# Patient Record
Sex: Male | Born: 1990 | Race: White | Hispanic: No | Marital: Married | Smoking: Current every day smoker
Health system: Southern US, Community
[De-identification: ages and names within clinical notes are randomized; demographics above are authoritative.]

## PROBLEM LIST (undated history)

## (undated) DIAGNOSIS — M1990 Unspecified osteoarthritis, unspecified site: Secondary | ICD-10-CM

## (undated) HISTORY — PX: CLAVICLE SURGERY: SHX598

---

## 2015-02-12 ENCOUNTER — Encounter (HOSPITAL_COMMUNITY): Payer: Self-pay | Admitting: *Deleted

## 2015-02-12 ENCOUNTER — Emergency Department (HOSPITAL_COMMUNITY)
Admission: EM | Admit: 2015-02-12 | Discharge: 2015-02-12 | Disposition: A | Discharge status: Home / Self Care | Payer: Self-pay | Attending: Physician Assistant | Admitting: Physician Assistant

## 2015-02-12 ENCOUNTER — Emergency Department (HOSPITAL_COMMUNITY): Payer: Self-pay

## 2015-02-12 DIAGNOSIS — S299XXA Unspecified injury of thorax, initial encounter: Secondary | ICD-10-CM | POA: Insufficient documentation

## 2015-02-12 DIAGNOSIS — Y9371 Activity, boxing: Secondary | ICD-10-CM | POA: Insufficient documentation

## 2015-02-12 DIAGNOSIS — Z720 Tobacco use: Secondary | ICD-10-CM | POA: Insufficient documentation

## 2015-02-12 DIAGNOSIS — Y998 Other external cause status: Secondary | ICD-10-CM | POA: Insufficient documentation

## 2015-02-12 DIAGNOSIS — Y9289 Other specified places as the place of occurrence of the external cause: Secondary | ICD-10-CM | POA: Insufficient documentation

## 2015-02-12 DIAGNOSIS — R0781 Pleurodynia: Secondary | ICD-10-CM

## 2015-02-12 DIAGNOSIS — W500XXA Accidental hit or strike by another person, initial encounter: Secondary | ICD-10-CM | POA: Insufficient documentation

## 2015-02-12 MED ORDER — HYDROCODONE-ACETAMINOPHEN 5-325 MG PO TABS
1.0000 | ORAL_TABLET | Freq: Once | ORAL | Status: AC
  Administered 2015-02-12: 1 via ORAL
  Filled 2015-02-12: qty 1

## 2015-02-12 NOTE — Discharge Instructions (Signed)
Rib Contusion °A rib contusion (bruise) can occur by a blow to the chest or by a fall against a hard object. Usually these will be much better in a couple weeks. If X-rays were taken today and there are no broken bones (fractures), the diagnosis of bruising is made. However, broken ribs may not show up for several days, or may be discovered later on a routine X-ray when signs of healing show up. If this happens to you, it does not mean that something was missed on the X-ray, but simply that it did not show up on the first X-rays. Earlier diagnosis will not usually change the treatment. °HOME CARE INSTRUCTIONS  °· Avoid strenuous activity. Be careful during activities and avoid bumping the injured ribs. Activities that pull on the injured ribs and cause pain should be avoided, if possible. °· For the first day or two, an ice pack used every 20 minutes while awake may be helpful. Put ice in a plastic bag and put a towel between the bag and the skin. °· Eat a normal, well-balanced diet. Drink plenty of fluids to avoid constipation. °· Take deep breaths several times a day to keep lungs free of infection. Try to cough several times a day. Splint the injured area with a pillow while coughing to ease pain. Coughing can help prevent pneumonia. °· Wear a rib belt or binder only if told to do so by your caregiver. If you are wearing a rib belt or binder, you must do the breathing exercises as directed by your caregiver. If not used properly, rib belts or binders restrict breathing which can lead to pneumonia. °· Only take over-the-counter or prescription medicines for pain, discomfort, or fever as directed by your caregiver. °SEEK MEDICAL CARE IF:  °· You or your child has an oral temperature above 102° F (38.9° C). °· Your baby is older than 3 months with a rectal temperature of 100.5° F (38.1° C) or higher for more than 1 day. °· You develop a cough, with thick or bloody sputum. °SEEK IMMEDIATE MEDICAL CARE IF:  °· You  have difficulty breathing. °· You feel sick to your stomach (nausea), have vomiting or belly (abdominal) pain. °· You have worsening pain, not controlled with medications, or there is a change in the location of the pain. °· You develop sweating or radiation of the pain into the arms, jaw or shoulders, or become light headed or faint. °· You or your child has an oral temperature above 102° F (38.9° C), not controlled by medicine. °· Your or your baby is older than 3 months with a rectal temperature of 102° F (38.9° C) or higher. °· Your baby is 3 months old or younger with a rectal temperature of 100.4° F (38° C) or higher. °MAKE SURE YOU:  °· Understand these instructions. °· Will watch your condition. °· Will get help right away if you are not doing well or get worse. °Document Released: 01/25/2001 Document Revised: 08/27/2012 Document Reviewed: 12/19/2007 °ExitCare® Patient Information ©2015 Exit[employer], LLC. This information is not intended to replace advice given to you by your health care provider. Make sure you discuss any questions you have with your health care provider. ° °
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
## 2015-02-12 NOTE — ED Notes (Signed)
Patient transported to X-ray 
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
## 2015-02-12 NOTE — ED Notes (Signed)
NP at bedside.

## 2015-04-13 ENCOUNTER — Emergency Department (HOSPITAL_COMMUNITY): Payer: Self-pay

## 2015-04-13 ENCOUNTER — Emergency Department (HOSPITAL_COMMUNITY)
Admission: EM | Admit: 2015-04-13 | Discharge: 2015-04-13 | Disposition: A | Discharge status: Home / Self Care | Payer: Self-pay | Attending: Emergency Medicine | Admitting: Emergency Medicine

## 2015-04-13 ENCOUNTER — Encounter (HOSPITAL_COMMUNITY): Payer: Self-pay | Admitting: Emergency Medicine

## 2015-04-13 DIAGNOSIS — F17200 Nicotine dependence, unspecified, uncomplicated: Secondary | ICD-10-CM | POA: Insufficient documentation

## 2015-04-13 DIAGNOSIS — W231XXA Caught, crushed, jammed, or pinched between stationary objects, initial encounter: Secondary | ICD-10-CM | POA: Insufficient documentation

## 2015-04-13 DIAGNOSIS — Z23 Encounter for immunization: Secondary | ICD-10-CM | POA: Insufficient documentation

## 2015-04-13 DIAGNOSIS — Y9289 Other specified places as the place of occurrence of the external cause: Secondary | ICD-10-CM | POA: Insufficient documentation

## 2015-04-13 DIAGNOSIS — Y998 Other external cause status: Secondary | ICD-10-CM | POA: Insufficient documentation

## 2015-04-13 DIAGNOSIS — Y9389 Activity, other specified: Secondary | ICD-10-CM | POA: Insufficient documentation

## 2015-04-13 DIAGNOSIS — S60221A Contusion of right hand, initial encounter: Secondary | ICD-10-CM | POA: Insufficient documentation

## 2015-04-13 DIAGNOSIS — S60511A Abrasion of right hand, initial encounter: Secondary | ICD-10-CM | POA: Insufficient documentation

## 2015-04-13 MED ORDER — TETANUS-DIPHTH-ACELL PERTUSSIS 5-2.5-18.5 LF-MCG/0.5 IM SUSP
0.5000 mL | Freq: Once | INTRAMUSCULAR | Status: AC
  Administered 2015-04-13: 0.5 mL via INTRAMUSCULAR
  Filled 2015-04-13: qty 0.5

## 2015-04-13 NOTE — ED Provider Notes (Signed)
CSN: [id number]     Arrival date & time 04/13/15  [id number] History   First MD Initiated Contact with Patient 04/13/15 [phone]     Chief Complaint  Patient presents with  . Hand Injury     (Consider location/radiation/quality/duration/timing/severity/associated sxs/prior Treatment) HPI Darren Dominguez is a 24 y.o. male with no medical history, presents to emergency department complaining of right hand injury. 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 He reports that it was stuck and he had to pull it out to free it. He reports he has abrasions and pain and swelling to the hand. Patient is able to move all fingers without difficulty. Denies numbness or weakness to the fingers. Patient states he just wants to make sure it is not broken. Patient states he applied bacitracin to the abrasions and iced his hand yesterday.  History reviewed. No pertinent past medical history. History reviewed. No pertinent past surgical history. No family history on file. Social History  Substance Use Topics  . Smoking status: Current Every Day Smoker  . Smokeless tobacco: None  . Alcohol Use: None    Review of Systems  Musculoskeletal: Positive for joint swelling and arthralgias.  Skin: Positive for wound.  Neurological: Negative for weakness and numbness.      Allergies  Review of patient's allergies indicates no known allergies.  Home Medications   Prior to Admission medications   Not on File   BP 92/77 mmHg  Pulse 87  Temp(Src) 97.6 F (36.4 C) (Oral)  Resp 17  SpO2 100% Physical Exam  Constitutional: He appears well-developed and well-nourished. No distress.  HENT:  Head: Normocephalic and atraumatic.  Eyes: Conjunctivae are normal.  Neck: Neck supple.  Cardiovascular: Normal rate, regular rhythm and normal heart sounds.   Pulmonary/Chest: Effort normal. No respiratory distress. He has no wheezes. He has no  rales.  Musculoskeletal:  Swelling noted to the right hand, over dorsal surface, over third, fourth, fifth MCP joints. Abrasions noted between third and fourth MCP joints. Full range of motion of all fingers. Cap refill less than 2 seconds distally. Sensation is intact over dorsal and palmar surfaces of the fingers. No tenderness to palpation distally. No swelling distal to the MCP joints   Nursing note and vitals reviewed.   ED Course  Procedures (including critical care time) Labs Review Labs Reviewed - No data to display  Imaging Review Dg Hand Complete Right  04/13/2015  CLINICAL DATA:  Injury.  Pain.  Initial evaluation. EXAM: RIGHT HAND - COMPLETE 3+ VIEW COMPARISON:  None. FINDINGS: Small amount of soft tissue air between the third and fourth metacarpals cannot be excluded. No radiopaque foreign body. No evidence of fracture dislocation. IMPRESSION: No evidence of fracture or dislocation. Small amount of soft tissue air may be present between the third and fourth metacarpals. No foreign body . Electronically Signed   By: Maisie Fushomas  Register   On: 04/13/2015 08:14   I have personally reviewed and evaluated these images and lab results as part of my medical decision-making.   EKG Interpretation None      MDM   Final diagnoses:  Hand contusion, right, initial encounter  Hand abrasion, right, initial encounter    Patient with a crush injury of the right hand while moving furniture yesterday. Patient has small abrasions between his knuckles of the hand. These are superficial. Advised to apply bacitracin twice a day. Tetanus updated.  Patient also has some swelling over the MCP joints of the hand. X-rays negative. Most likely deep contusion. Ace wrap provided for compression. Advised to ice, elevate, ibuprofen for pain. Follow up as needed.   Filed Vitals:   04/13/15 0738 04/13/15 0745 04/13/15 0815  BP: 140/78 127/82 92/77  Pulse: 90 93 87  Temp: 97.6 F (36.4 C)    TempSrc: Oral     Resp: 17    SpO2: 100% 98% 100%       Jaynie Crumble, PA-C 04/15/15 0248  Eber Hong, MD 04/15/15 1034

## 2015-04-13 NOTE — Discharge Instructions (Signed)
Keep your hand elevated at home. Ace wrap for compression. Tylenol and Motrin for pain. Bacitracin to the abrasions twice a day. Follow up as needed.  Crush Injury, Fingers or Toes A crush injury to the fingers or toes means the tissues have been damaged by being squeezed (compressed). There will be bleeding into the tissues and swelling. Often, blood will collect under the skin. When this happens, the skin on the finger often dies and may slough off (shed) 1 week to 10 days later. Usually, new skin is growing underneath. If the injury has been too severe and the tissue does not survive, the damaged tissue may begin to turn black over several days.  Wounds which occur because of the crushing may be stitched (sutured) shut. However, crush injuries are more likely to become infected than other injuries.These wounds may not be closed as tightly as other types of cuts to prevent infection. Nails involved are often lost. These usually grow back over several weeks.  DIAGNOSIS X-rays may be taken to see if there is any injury to the bones. TREATMENT Broken bones (fractures) may be treated with splinting, depending on the fracture. Often, no treatment is required for fractures of the last bone in the fingers or toes. HOME CARE INSTRUCTIONS   The crushed part should be raised (elevated) above the heart or center of the chest as much as possible for the first several days or as directed. This helps with pain and lessens swelling. Less swelling increases the chances that the crushed part will survive.  Put ice on the injured area.  Put ice in a plastic bag.  Place a towel between your skin and the bag.  Leave the ice on for 15-20 minutes, 03-04 times a day for the first 2 days.  Only take over-the-counter or prescription medicines for pain, discomfort, or fever as directed by your caregiver.  Use your injured part only as directed.  Change your bandages (dressings) as directed.  Keep all follow-up  appointments as directed by your caregiver. Not keeping your appointment could result in a chronic or permanent injury, pain, and disability. If there is any problem keeping the appointment, you must call to reschedule. SEEK IMMEDIATE MEDICAL CARE IF:   There is redness, swelling, or increasing pain in the wound area.  Pus is coming from the wound.  You have a fever.  You notice a bad smell coming from the wound or dressing.  The edges of the wound do not stay together after the sutures have been removed.  You are unable to move the injured finger or toe. MAKE SURE YOU:   Understand these instructions.  Will watch your condition.  Will get help right away if you are not doing well or get worse.   This information is not intended to replace advice given to you by your health care provider. Make sure you discuss any questions you have with your health care provider.   Document Released: 05/02/2005 Document Revised: 07/25/2011 Document Reviewed: 09/17/2010 Elsevier Interactive Patient Education [employer]2016 Elsevier Inc.

## 2015-04-13 NOTE — ED Notes (Signed)
Patient undressed, in gown, on continuous pulse oximetry and blood pressure cuff; Kaitlyn, EMT present in room

## 2015-04-13 NOTE — ED Notes (Signed)
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 Laceration noted to 3-5 digits. Patient able to move all digits. Denise any change in sensation.

## 2015-08-19 DIAGNOSIS — F1110 Opioid abuse, uncomplicated: Secondary | ICD-10-CM | POA: Insufficient documentation

## 2015-08-19 DIAGNOSIS — M791 Myalgia: Secondary | ICD-10-CM | POA: Insufficient documentation

## 2015-08-19 DIAGNOSIS — R110 Nausea: Secondary | ICD-10-CM | POA: Insufficient documentation

## 2015-08-19 DIAGNOSIS — F17200 Nicotine dependence, unspecified, uncomplicated: Secondary | ICD-10-CM | POA: Insufficient documentation

## 2015-08-19 DIAGNOSIS — F919 Conduct disorder, unspecified: Secondary | ICD-10-CM | POA: Insufficient documentation

## 2015-08-20 ENCOUNTER — Encounter (HOSPITAL_COMMUNITY): Payer: Self-pay | Admitting: Emergency Medicine

## 2015-08-20 ENCOUNTER — Emergency Department (HOSPITAL_COMMUNITY)
Admission: EM | Admit: 2015-08-20 | Discharge: 2015-08-20 | Disposition: A | Discharge status: Home / Self Care | Payer: Self-pay | Attending: Emergency Medicine | Admitting: Emergency Medicine

## 2015-08-20 DIAGNOSIS — F1110 Opioid abuse, uncomplicated: Secondary | ICD-10-CM

## 2015-08-20 MED ORDER — IBUPROFEN 800 MG PO TABS
800.0000 mg | ORAL_TABLET | Freq: Once | ORAL | Status: AC
  Administered 2015-08-20: 800 mg via ORAL
  Filled 2015-08-20: qty 1

## 2015-08-20 MED ORDER — CLONIDINE HCL 0.1 MG PO TABS
0.1000 mg | ORAL_TABLET | Freq: Once | ORAL | Status: AC
  Administered 2015-08-20: 0.1 mg via ORAL
  Filled 2015-08-20: qty 1

## 2015-08-20 MED ORDER — ONDANSETRON 4 MG PO TBDP
4.0000 mg | ORAL_TABLET | Freq: Once | ORAL | Status: AC
  Administered 2015-08-20: 4 mg via ORAL
  Filled 2015-08-20: qty 1

## 2015-08-20 MED ORDER — PROMETHAZINE HCL 25 MG PO TABS: 25.0000 mg | ORAL_TABLET | Freq: Four times a day (QID) | ORAL | Status: AC | PRN

## 2015-08-20 MED ORDER — CLONIDINE HCL 0.1 MG PO TABS: 0.1000 mg | ORAL_TABLET | Freq: Two times a day (BID) | ORAL | Status: AC | PRN

## 2015-08-20 MED ORDER — IBUPROFEN 400 MG PO TABS: 400.0000 mg | ORAL_TABLET | Freq: Four times a day (QID) | ORAL | Status: DC | PRN

## 2015-08-20 NOTE — Discharge Instructions (Signed)
Opioid Withdrawal Opioids are a group of narcotic drugs. They include the street drug heroin. They also include pain medicines, such as morphine, hydrocodone, oxycodone, and fentanyl. Opioid withdrawal is a group of characteristic physical and mental signs and symptoms. It typically occurs if you have been using opioids daily for several weeks or longer and stop using or rapidly decrease use. Opioid withdrawal can also occur if you have used opioids daily for a long time and are given a medicine to block the effect.  SIGNS AND SYMPTOMS Opioid withdrawal includes three or more of the following symptoms:   Depressed, anxious, or irritable mood.  Nausea or vomiting.  Muscle aches or spasms.   Watery eyes.   Runny nose.  Dilated pupils, sweating, or hairs standing on end.  Diarrhea or intestinal cramping.  Yawning.   Fever.  Increased blood pressure.  Fast pulse.  Restlessness or trouble sleeping. These signs and symptoms occur within several hours of stopping or reducing short-acting opioids, such as heroin. They can occur within 3 days of stopping or reducing long-acting opioids, such as methadone. Withdrawal begins within minutes of receiving a drug that blocks the effects of opioids, such as naltrexone or naloxone. DIAGNOSIS  Opioid use disorder is diagnosed by your health care provider. You will be asked about your symptoms, drug and alcohol use, medical history, and use of medicines. A physical exam may be done. Lab tests may be ordered. Your health care provider may have you see a mental health professional.  TREATMENT  The treatment for opioid withdrawal is usually provided by medical doctors with special training in substance use disorders (addiction specialists). The following medicines may be included in treatment:  Opioids given in place of the abused opioid. They turn on opioid receptors in the brain and lessen or prevent withdrawal symptoms. They are gradually  decreased (opioid substitution and taper).  Non-opioids that can lessen certain opioid withdrawal symptoms. They may be used alone or with opioid substitution and taper. Successful long-term recovery usually requires medicine, counseling, and group support. HOME CARE INSTRUCTIONS   Take medicines only as directed by your health care provider.  Check with your health care provider before starting new medicines.  Keep all follow-up visits as directed by your health care provider. SEEK MEDICAL CARE IF:  You are not able to take your medicines as directed.  Your symptoms get worse.  You relapse. SEEK IMMEDIATE MEDICAL CARE IF:  You have serious thoughts about hurting yourself or others.  You have a seizure.  You lose consciousness.   This information is not intended to replace advice given to you by your health care provider. Make sure you discuss any questions you have with your health care provider.   Document Released: 05/05/2003 Document Revised: 05/23/2014 Document Reviewed: 05/15/2013 Elsevier Interactive Patient Education 2016 [employer].  [employer] Guide Inpatient Behavioral Health/Residential  Substance Abuse Treatment Adults The United Ways 211 is a great source of information about community services available.  Access by dialing 2-1-1 from anywhere in [place], or by website -  PooledIncome.pl.   (Updated 05/2015)  Crisis Assistance 24 hours a day   Services Offered    [facility] [employer]  24-hour crisis assistance: [phone] [place], [place]   [facility]  24-hour crisis assistance:[phone] [place], [place]  [facility]   24-hour crisis assistance: [phone] [place], [place]   [facility] Access to Care Line  24-hour crisis assistance; [phone] All   [facility]  24-hour crisis response line: [phone] All   Other Local Resources (Updated  05/2015)  Inpatient Behavioral Health/Residential Substance Abuse Treatment Programs   Services      Address and Phone Number  [facility])   14-day residential rehabilitation  [phone] 100 [address] [place], [place]  [facility] ([facility])    Detox - private pay only  14-day residential rehabilitation -  Medicaid, insurance, private pay only [phone], or [phone] [address], [place], [place] [address]   [facility]  Private Insurance only  Multiple facilities [phone] admissions   [facility] ([facility])   90-day program  Must be homeless to participate  [phone], or [phone] [place], [facility]  [facility]     Private Insurance only [phone], or  [phone] [address] [place], [place] [address]  [facility]     Must make an appointment  Transportation is offered from [employer] on [address]  Accepts private pay, Sheryn BisonMedicare, [place] [facility] [place] Medicaid [phone]  [address] [place] [facility] [place], [place] [address]   [facility]  Females only  Associated with [facility] [phone]) [address] [place], [place] [address]  [facility]   Private insurance only [phone], or [phone] [address] [place], [place]  [facility]    Detox  Residential rehabilitation  Private insurance only  Multiple locations [phone] admissions  [facility]    Private pay  Private insurance [phone] [address] [place], [place] [address]  [facility]    Males only  Fee required at time of admission [phone] [address] [place], [place] [address]  [facility]    Private pay only  [phone] [place], [place]  [facility] ([facility])    Detox - private pay, Medicaid  Residential  rehabilitation for males  - Medicare, Medicaid, insurance, private pay [phone] [address] [place], [place]   [facility]    Walk-in interviews Monday - Saturday from 8 am - 4 pm  Individuals with legal charges are not eligible [phone] [address] [place], [place] [address]  [facility]   Must be willing to work  Must attend Alcoholics Anonymous meetings [phone] [address] [place], [place]   [facility]    Faith-based program  Private pay only [phone] [address] [place], [place]

## 2015-08-20 NOTE — ED Notes (Signed)
Pt requesting detox from opiates. Pt states he has been buying and injecting Roxicodone x 1 month and feels he has reached his bottom.

## 2015-08-20 NOTE — ED Provider Notes (Signed)
CSN: [id number]     Arrival date & time 08/19/15  2241 History   First MD Initiated Contact with Patient 08/20/15 0138     Chief Complaint  Patient presents with  . Detox      (Consider location/radiation/quality/duration/timing/severity/associated sxs/prior Treatment) HPI Comments: 25 year old male with no significant past medical history presents to the emergency department for evaluation for detox from opiates. Patient states that he has been using IV heroin and Roxicodone 1 month. He states he has a history of IV drug abuse. He was sober for 3 years before he started using again. His last use of IV heroin was yesterday morning. He reports feeling mildly achy and tremulous with some nausea. He last [place] detox and [place]. He denies any suicidal or homicidal thoughts. Patient further denies alcohol use.  The history is provided by the patient. No language interpreter was used.    History reviewed. No pertinent past medical history. Past Surgical History  Procedure Laterality Date  . Clavicle surgery     No family history on file. Social History  Substance Use Topics  . Smoking status: Current Every Day Smoker  . Smokeless tobacco: None  . Alcohol Use: No    Review of Systems  Gastrointestinal: Positive for nausea.  Musculoskeletal: Positive for myalgias.  Psychiatric/Behavioral: Positive for behavioral problems. Negative for suicidal ideas.  All other systems reviewed and are negative.   Allergies  Review of patient's allergies indicates no known allergies.  Home Medications   Prior to Admission medications   Medication Sig Start Date End Date Taking? Authorizing Provider  cloNIDine (CATAPRES) 0.1 MG tablet Take 1 tablet (0.1 mg total) by mouth 2 (two) times daily as needed (for agitation). 08/20/15   Antony MaduraKelly Xayla Puzio, PA-C  ibuprofen (ADVIL,MOTRIN) 400 MG tablet Take 1 tablet (400 mg total) by mouth every 6 (six) hours as needed. 08/20/15    Antony MaduraKelly Edrian Melucci, PA-C  promethazine (PHENERGAN) 25 MG tablet Take 1 tablet (25 mg total) by mouth every 6 (six) hours as needed for nausea or vomiting. 08/20/15   Antony MaduraKelly Marthann Abshier, PA-C   BP 129/75 mmHg  Pulse 72  Temp(Src) 97.5 F (36.4 C) (Oral)  Resp 16  Ht 5\' 10"  (1.778 m)  Wt 63.504 kg  BMI 20.09 kg/m2  SpO2 98%   Physical Exam  Constitutional: He is oriented to person, place, and time. He appears well-developed and well-nourished. No distress.  Patient calm and cooperative. He is in no acute distress.  HENT:  Head: Normocephalic and atraumatic.  Eyes: Conjunctivae and EOM are normal. No scleral icterus.  Neck: Normal range of motion.  Pulmonary/Chest: Effort normal. No respiratory distress.  Musculoskeletal: Normal range of motion.  Neurological: He is alert and oriented to person, place, and time. He exhibits normal muscle tone. Coordination normal.  Skin: Skin is warm and dry. No rash noted. He is not diaphoretic. No erythema. No pallor.  Psychiatric: He has a normal mood and affect. His behavior is normal. He expresses no homicidal and no suicidal ideation.  Nursing note and vitals reviewed.   ED Course  Procedures (including critical care time) Labs Review Labs Reviewed - No data to display  Imaging Review No results found. I have personally reviewed and evaluated these images and lab results as part of my medical decision-making.   EKG Interpretation None      MDM   Final diagnoses:  Opiate abuse, continuous    25 year old male presents requesting detox from opiates. He  denies suicidality as well as any homicidal ideations. Patient is calm and cooperative. He has stable vital signs. Patient has been informed that inpatient detox is no longer offered at our facility. He will be discharged with medications for symptomatic management. Patient given resource guide for outpatient detox and rehabilitation. Return precautions given at discharge as well. Patient discharged in  satisfactory condition with no unaddressed concerns.   Filed Vitals:   08/20/15 0040  BP: 129/75  Pulse: 72  Temp: 97.5 F (36.4 C)  TempSrc: Oral  Resp: 16  Height:  (1.778 m)  Weight: 63.504 kg  SpO2: 98%      Antony Madura, PA-C 08/20/15 0241  Gilda Crease, MD 08/20/15 [phone]

## 2016-01-11 ENCOUNTER — Emergency Department (HOSPITAL_COMMUNITY)
Admission: EM | Admit: 2016-01-11 | Discharge: 2016-01-11 | Disposition: A | Discharge status: Home / Self Care | Payer: Self-pay | Attending: Emergency Medicine | Admitting: Emergency Medicine

## 2016-01-11 ENCOUNTER — Encounter (HOSPITAL_COMMUNITY): Payer: Self-pay | Admitting: *Deleted

## 2016-01-11 DIAGNOSIS — W268XXA Contact with other sharp object(s), not elsewhere classified, initial encounter: Secondary | ICD-10-CM | POA: Insufficient documentation

## 2016-01-11 DIAGNOSIS — F17200 Nicotine dependence, unspecified, uncomplicated: Secondary | ICD-10-CM | POA: Insufficient documentation

## 2016-01-11 DIAGNOSIS — S61219A Laceration without foreign body of unspecified finger without damage to nail, initial encounter: Secondary | ICD-10-CM

## 2016-01-11 DIAGNOSIS — S61012A Laceration without foreign body of left thumb without damage to nail, initial encounter: Secondary | ICD-10-CM | POA: Insufficient documentation

## 2016-01-11 DIAGNOSIS — Y929 Unspecified place or not applicable: Secondary | ICD-10-CM | POA: Insufficient documentation

## 2016-01-11 DIAGNOSIS — Y999 Unspecified external cause status: Secondary | ICD-10-CM | POA: Insufficient documentation

## 2016-01-11 DIAGNOSIS — Y939 Activity, unspecified: Secondary | ICD-10-CM | POA: Insufficient documentation

## 2016-01-11 MED ORDER — LIDOCAINE HCL (PF) 1 % IJ SOLN
10.0000 mL | Freq: Once | INTRAMUSCULAR | Status: AC
  Administered 2016-01-11: 10 mL via INTRADERMAL
  Filled 2016-01-11: qty 10

## 2016-01-11 NOTE — ED Triage Notes (Signed)
Pt states he cut his finger with a drill bit two hours ago. Bleeding controlled. Last tetanus 2 years ago.

## 2016-01-11 NOTE — Progress Notes (Signed)
Orthopedic Tech Progress Note Patient Details:  Darren Dominguez 14-Jan-1991 [id number]  Ortho Devices Type of Ortho Device: Finger splint Ortho Device/Splint Location: LUE thumb Ortho Device/Splint Interventions: Ordered, Application   Jennye MoccasinHughes, [name] 01/11/2016, 10:28 PM

## 2016-01-11 NOTE — ED Provider Notes (Signed)
[facility]-EMERGENCY DEPT Provider Note   CSN: [id number] Arrival date & time: 01/11/16  [id number]   By signing my name below, I, Darren Dominguez, attest that this documentation has been prepared under the direction and in the presence of non-physician practitioner, Arthor CaptainAbigail Keeghan Bialy, PA-C. Electronically Signed: Nelwyn SalisburyJoshua Dominguez, Scribe. 01/11/2016. 9:15 PM.   History   Chief Complaint Chief Complaint  Patient presents with  . Laceration   The history is provided by the patient. No language interpreter was used.     HPI Comments:  Darren Dominguez is a 25 y.o. male who presents to the Emergency Department complaining of sudden-onset unchanged left thumb laceration occurring about  4 hours ago. He endorses associated pain to the area, worsened by palpation. No alleviating factors indicated. Pt reports that a drill bit went into his finger. Pt denies any fever or chills. He reports that his tetanus status is up to date.   History reviewed. No pertinent past medical history.  There are no active problems to display for this patient.   Past Surgical History:  Procedure Laterality Date  . CLAVICLE SURGERY       Home Medications    Prior to Admission medications   Medication Sig Start Date End Date Taking? Authorizing Provider  cloNIDine (CATAPRES) 0.1 MG tablet Take 1 tablet (0.1 mg total) by mouth 2 (two) times daily as needed (for agitation). 08/20/15   Antony MaduraKelly Humes, PA-C  ibuprofen (ADVIL,MOTRIN) 400 MG tablet Take 1 tablet (400 mg total) by mouth every 6 (six) hours as needed. 08/20/15   Antony MaduraKelly Humes, PA-C  promethazine (PHENERGAN) 25 MG tablet Take 1 tablet (25 mg total) by mouth every 6 (six) hours as needed for nausea or vomiting. 08/20/15   Antony MaduraKelly Humes, PA-C    Family History History reviewed. No pertinent family history.  Social History Social History  Substance Use Topics  . Smoking status: Current Every Day Smoker  . Smokeless tobacco: Never Used  . Alcohol use No     Allergies     Review of patient's allergies indicates no known allergies.   Review of Systems Review of Systems  Constitutional: Negative for chills and fever.  Skin: Positive for wound.     Physical Exam Updated Vital Signs BP 130/66   Pulse 73   Temp 98.3 F (36.8 C) (Oral)   Resp 16   Ht 5\' 10"  (1.778 m)   Wt 155 lb 7 oz (70.5 kg)   SpO2 99%   BMI 22.30 kg/m   Physical Exam  Constitutional: He is oriented to person, place, and time. He appears well-developed and well-nourished.  HENT:  Head: Normocephalic and atraumatic.  Eyes: EOM are normal.  Neck: Normal range of motion.  Cardiovascular: Normal rate, regular rhythm, normal heart sounds and intact distal pulses.   Pulmonary/Chest: Effort normal and breath sounds normal. No respiratory distress.  Abdominal: Soft. He exhibits no distension. There is no tenderness.  Musculoskeletal: Normal range of motion.  Neurological: He is alert and oriented to person, place, and time.  Skin: Skin is warm and dry.  Laceration involving the left thumb about 1cm elliptical. Involves nail cuticle. Doesn't appear to have any nail bed injury.   Psychiatric: He has a normal mood and affect. Judgment normal.  Nursing note and vitals reviewed.    ED Treatments / Results  DIAGNOSTIC STUDIES:  Oxygen Saturation is 99% on RA, normal by my interpretation.    COORDINATION OF CARE:  9:33 PM Discussed treatment plan with pt at  bedside which included painkillers and sutures and pt agreed to plan.  Labs (all labs ordered are listed, but only abnormal results are displayed) Labs Reviewed - No data to display  EKG  EKG Interpretation None       Radiology No results found.  Procedures .Marland KitchenLaceration Repair Date/Time: 01/11/2016 9:40 PM Performed by: Arthor Captain Authorized by: Arthor Captain   Consent:    Consent obtained:  Verbal   Consent given by:  Patient Anesthesia (see MAR for exact dosages):    Anesthesia method:  Local  infiltration   Local anesthetic:  Lidocaine 1% w/o epi Laceration details:    Location:  Finger   Finger location:  L thumb   Length (cm):  1 Repair type:    Repair type:  Simple Treatment:    Area cleansed with:  Saline and Betadine   Amount of cleaning:  Standard   Irrigation solution:  Sterile saline   Visualized foreign bodies/material removed: no   Skin repair:    Repair method:  Sutures   Suture size:  5-0   Wound skin closure material used: FivoVicryl.   Suture technique:  Simple interrupted   Number of sutures:  4 Post-procedure details:    Patient tolerance of procedure:  Tolerated well, no immediate complications    (including critical care time)  Medications Ordered in ED Medications  lidocaine (PF) (XYLOCAINE) 1 % injection 10 mL (not administered)   Tetanus UTD. Laceration occurred < 12 hours prior to repair. Discussed laceration care with pt and answered questions. Pt to f-u for suture removal in  days and wound check sooner should there be signs of dehiscence or infection. Pt is hemodynamically stable with no complaints prior to dc.     Initial Impression / Assessment and Plan / ED Course  I have reviewed the triage vital signs and the nursing notes.  Pertinent labs & imaging results that were available during my care of the patient were reviewed by me and considered in my medical decision making (see chart for details).  Clinical Course       Final Clinical Impressions(s) / ED Diagnoses   Final diagnoses:  Finger laceration, initial encounter    New Prescriptions New Prescriptions   No medications on file  I personally performed the services described in this documentation, which was scribed in my presence. The recorded information has been reviewed and is accurate.        Arthor Captain, PA-C 01/11/16 2230    Marily Memos, MD 01/14/16 [phone]

## 2016-01-11 NOTE — Discharge Instructions (Signed)
WOUND CARE Please return if you have concerns about your wound.  Keep area clean and dry for 24 hours. Do not remove bandage, if applied.  After 24 hours, remove bandage and wash wound gently with mild soap and warm water. Reapply a new bandage after cleaning wound, if directed.  Continue daily cleansing with soap and water until stitches/staples are removed.  Do not apply any ointments or creams to the wound while stitches/staples are in place, as this may cause delayed healing.  Notify the office if you experience any of the following signs of infection: Swelling, redness, pus drainage, streaking, fever >101.0 F  Notify the office if you experience excessive bleeding that does not stop after 15-20 minutes of constant, firm pressure.

## 2017-03-15 ENCOUNTER — Encounter (HOSPITAL_COMMUNITY): Payer: Self-pay | Admitting: Emergency Medicine

## 2017-03-15 ENCOUNTER — Emergency Department (HOSPITAL_COMMUNITY)
Admission: EM | Admit: 2017-03-15 | Discharge: 2017-03-15 | Disposition: A | Discharge status: Home / Self Care | Payer: Self-pay | Attending: Emergency Medicine | Admitting: Emergency Medicine

## 2017-03-15 DIAGNOSIS — F17200 Nicotine dependence, unspecified, uncomplicated: Secondary | ICD-10-CM | POA: Insufficient documentation

## 2017-03-15 DIAGNOSIS — S39012A Strain of muscle, fascia and tendon of lower back, initial encounter: Secondary | ICD-10-CM | POA: Insufficient documentation

## 2017-03-15 DIAGNOSIS — T148XXA Other injury of unspecified body region, initial encounter: Secondary | ICD-10-CM

## 2017-03-15 DIAGNOSIS — Y92009 Unspecified place in unspecified non-institutional (private) residence as the place of occurrence of the external cause: Secondary | ICD-10-CM | POA: Insufficient documentation

## 2017-03-15 DIAGNOSIS — X500XXA Overexertion from strenuous movement or load, initial encounter: Secondary | ICD-10-CM | POA: Insufficient documentation

## 2017-03-15 DIAGNOSIS — Y93H3 Activity, building and construction: Secondary | ICD-10-CM | POA: Insufficient documentation

## 2017-03-15 DIAGNOSIS — M62830 Muscle spasm of back: Secondary | ICD-10-CM

## 2017-03-15 DIAGNOSIS — Y998 Other external cause status: Secondary | ICD-10-CM | POA: Insufficient documentation

## 2017-03-15 MED ORDER — IBUPROFEN 200 MG PO TABS
600.0000 mg | ORAL_TABLET | Freq: Once | ORAL | Status: AC
  Administered 2017-03-15: 600 mg via ORAL
  Filled 2017-03-15: qty 3

## 2017-03-15 MED ORDER — CYCLOBENZAPRINE HCL 10 MG PO TABS: 10.0000 mg | ORAL_TABLET | Freq: Two times a day (BID) | ORAL | 0 refills | Status: AC | PRN

## 2017-03-15 MED ORDER — CYCLOBENZAPRINE HCL 10 MG PO TABS
10.0000 mg | ORAL_TABLET | Freq: Once | ORAL | Status: AC
  Administered 2017-03-15: 10 mg via ORAL
  Filled 2017-03-15: qty 1

## 2017-03-15 MED ORDER — DICLOFENAC SODIUM 50 MG PO TBEC: 50.0000 mg | DELAYED_RELEASE_TABLET | Freq: Two times a day (BID) | ORAL | 0 refills | Status: AC

## 2017-03-15 NOTE — ED Provider Notes (Signed)
[facility]-EMERGENCY DEPT Provider Note   CSN: [id number] Arrival date & time: 03/15/17  1623     History   Chief Complaint Chief Complaint  Patient presents with  . Back Pain    HPI Darren Dominguez is a 26 y.o. male who presents to the ED with back pain. 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 The next morning he had a hard time getting out of bed due to pain. Today he tried to go back to work but the pain got worse. The pain starts in the lower back and radiates to the upper back and into the neck on the right side. The lower back pain is on both sides. Patient has taken tylenol without relief.  The history is provided by the patient. No language interpreter was used.  Back Pain   This is a new problem. The current episode started more than 2 days ago. The problem occurs constantly. The problem has been gradually worsening. The pain is associated with lifting heavy objects. The pain is present in the lumbar spine and thoracic spine. The quality of the pain is described as shooting (sharp). The pain is at a severity of 8/10. Pertinent negatives include no chest pain, no fever, no numbness, no headaches, no bowel incontinence, no bladder incontinence, no dysuria and no weakness.    History reviewed. No pertinent past medical history.  There are no active problems to display for this patient.   Past Surgical History:  Procedure Laterality Date  . CLAVICLE SURGERY         Home Medications    Prior to Admission medications   Medication Sig Start Date End Date Taking? Authorizing Provider  cloNIDine (CATAPRES) 0.1 MG tablet Take 1 tablet (0.1 mg total) by mouth 2 (two) times daily as needed (for agitation). 08/20/15   Antony MaduraHumes, Kelly, PA-C  cyclobenzaprine (FLEXERIL) 10 MG tablet Take 1 tablet (10 mg total) by mouth 2 (two) times daily as needed for muscle spasms. 03/15/17   Janne NapoleonNeese, Avyanna Spada M, NP  diclofenac (VOLTAREN) 50 MG EC tablet  Take 1 tablet (50 mg total) by mouth 2 (two) times daily. 03/15/17   Janne NapoleonNeese, Brandice Busser M, NP  promethazine (PHENERGAN) 25 MG tablet Take 1 tablet (25 mg total) by mouth every 6 (six) hours as needed for nausea or vomiting. 08/20/15   Antony MaduraHumes, Kelly, PA-C    Family History No family history on file.  Social History Social History  Substance Use Topics  . Smoking status: Current Every Day Smoker  . Smokeless tobacco: Never Used  . Alcohol use No     Allergies   Patient has no known allergies.   Review of Systems Review of Systems  Constitutional: Negative for chills, diaphoresis, fatigue and fever.  HENT: Negative for congestion, ear pain, facial swelling, sinus pressure and sore throat.   Eyes: Negative for photophobia, pain, discharge and visual disturbance.  Respiratory: Negative for cough, chest tightness, shortness of breath and wheezing.   Cardiovascular: Negative for chest pain.  Gastrointestinal: Negative for abdominal distention, bowel incontinence, nausea and vomiting.  Genitourinary: Negative for bladder incontinence, difficulty urinating, dysuria, flank pain, frequency and urgency.  Musculoskeletal: Positive for back pain and neck pain. Negative for gait problem and neck stiffness.  Skin: Negative for color change and rash.  Neurological: Negative for dizziness, speech difficulty, weakness, light-headedness, numbness and headaches.  Psychiatric/Behavioral: Negative for confusion. The patient is not nervous/anxious.      Physical  Exam Updated Vital Signs BP 122/78 (BP Location: Right Arm)   Pulse 82   Temp 98.2 F (36.8 C) (Oral)   Resp 16   SpO2 100%   Physical Exam  Constitutional: He is oriented to person, place, and time. He appears well-developed and well-nourished. No distress.  HENT:  Head: Normocephalic and atraumatic.  Mouth/Throat: Mucous membranes are normal.  Eyes: Pupils are equal, round, and reactive to light. EOM are normal.  Neck: Neck supple. No JVD  present. Muscular tenderness (right side) present. No spinous process tenderness present. No neck rigidity. No edema present.  Cardiovascular: Normal rate and regular rhythm.   Pulmonary/Chest: Effort normal and breath sounds normal.  Abdominal: Soft. There is no tenderness.  Musculoskeletal: Normal range of motion. He exhibits no edema.       Cervical back: He exhibits tenderness and spasm. He exhibits normal range of motion.       Thoracic back: He exhibits tenderness. He exhibits normal pulse.       Lumbar back: He exhibits tenderness and spasm. He exhibits no deformity and normal pulse. Decreased range of motion: due to pain.  Neurological: He is alert and oriented to person, place, and time. He has normal strength.  Reflex Scores:      Bicep reflexes are 2+ on the right side and 2+ on the left side.      Brachioradialis reflexes are 2+ on the right side and 2+ on the left side.      Patellar reflexes are 2+ on the right side and 2+ on the left side. Patient with steady gait, no foot drag.   Skin: Skin is warm and dry.  Psychiatric: He has a normal mood and affect. His behavior is normal.  Nursing note and vitals reviewed.    ED Treatments / Results  Labs (all labs ordered are listed, but only abnormal results are displayed) Labs Reviewed - No data to display  EKG  EKG Interpretation None       Radiology No results found.  Procedures Procedures (including critical care time)  Medications Ordered in ED Medications  cyclobenzaprine (FLEXERIL) tablet 10 mg (10 mg Oral Given 03/15/17 1747)  ibuprofen (ADVIL,MOTRIN) tablet 600 mg (600 mg Oral Given 03/15/17 1747)     Initial Impression / Assessment and Plan / ED Course  I have reviewed the triage vital signs and the nursing notes. Patient with back pain.  No neurological deficits and normal neuro exam.  Patient can walk but states is painful.  No loss of bowel or bladder control.  No concern for cauda equina.  No fever,  night sweats, weight loss, h/o cancer, IVDU.  RICE protocol and pain medicine indicated and discussed with patient. Patient appears safe for d/c. Will treat with muscle relaxants and NSAIDS. Return precautions discussed  Final Clinical Impressions(s) / ED Diagnoses   Final diagnoses:  Muscle strain  Muscle spasm of back    New Prescriptions New Prescriptions   CYCLOBENZAPRINE (FLEXERIL) 10 MG TABLET    Take 1 tablet (10 mg total) by mouth 2 (two) times daily as needed for muscle spasms.   DICLOFENAC (VOLTAREN) 50 MG EC TABLET    Take 1 tablet (50 mg total) by mouth 2 (two) times daily.     Kerrie [place], [place] 03/15/17 Nicholos Johns    Charlynne Pander, MD 03/15/17 [phone]

## 2017-03-15 NOTE — ED Triage Notes (Signed)
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

## 2017-03-15 NOTE — Discharge Instructions (Signed)
The medications can make you sleepy. So do not do any activity that may cause injury while taking the medication.

## 2017-04-26 ENCOUNTER — Other Ambulatory Visit: Payer: Self-pay | Admitting: Internal Medicine

## 2017-04-26 ENCOUNTER — Ambulatory Visit
Admission: RE | Admit: 2017-04-26 | Discharge: 2017-04-26 | Disposition: A | Discharge status: Home / Self Care | Payer: BLUE CROSS/BLUE SHIELD | Source: Ambulatory Visit | Attending: Internal Medicine | Admitting: Internal Medicine

## 2017-04-26 DIAGNOSIS — M545 Low back pain: Secondary | ICD-10-CM

## 2017-04-26 DIAGNOSIS — M542 Cervicalgia: Secondary | ICD-10-CM

## 2017-06-02 ENCOUNTER — Other Ambulatory Visit: Payer: Self-pay

## 2017-06-02 ENCOUNTER — Encounter (HOSPITAL_COMMUNITY): Payer: Self-pay

## 2017-06-02 ENCOUNTER — Emergency Department (HOSPITAL_COMMUNITY)
Admission: EM | Admit: 2017-06-02 | Discharge: 2017-06-02 | Disposition: A | Discharge status: Home / Self Care | Payer: BLUE CROSS/BLUE SHIELD | Attending: Emergency Medicine | Admitting: Emergency Medicine

## 2017-06-02 DIAGNOSIS — Z79899 Other long term (current) drug therapy: Secondary | ICD-10-CM | POA: Insufficient documentation

## 2017-06-02 DIAGNOSIS — F17210 Nicotine dependence, cigarettes, uncomplicated: Secondary | ICD-10-CM | POA: Insufficient documentation

## 2017-06-02 DIAGNOSIS — T1501XA Foreign body in cornea, right eye, initial encounter: Secondary | ICD-10-CM | POA: Insufficient documentation

## 2017-06-02 DIAGNOSIS — Y999 Unspecified external cause status: Secondary | ICD-10-CM | POA: Diagnosis not present

## 2017-06-02 DIAGNOSIS — X58XXXA Exposure to other specified factors, initial encounter: Secondary | ICD-10-CM | POA: Insufficient documentation

## 2017-06-02 DIAGNOSIS — Y9269 Other specified industrial and construction area as the place of occurrence of the external cause: Secondary | ICD-10-CM | POA: Insufficient documentation

## 2017-06-02 DIAGNOSIS — S058X1A Other injuries of right eye and orbit, initial encounter: Secondary | ICD-10-CM | POA: Diagnosis present

## 2017-06-02 DIAGNOSIS — Y93H3 Activity, building and construction: Secondary | ICD-10-CM | POA: Insufficient documentation

## 2017-06-02 DIAGNOSIS — H18891 Other specified disorders of cornea, right eye: Secondary | ICD-10-CM

## 2017-06-02 DIAGNOSIS — Z23 Encounter for immunization: Secondary | ICD-10-CM | POA: Diagnosis not present

## 2017-06-02 HISTORY — DX: Unspecified osteoarthritis, unspecified site: M19.90

## 2017-06-02 MED ORDER — FLUORESCEIN SODIUM 1 MG OP STRP
1.0000 | ORAL_STRIP | Freq: Once | OPHTHALMIC | Status: AC
  Administered 2017-06-02: 1 via OPHTHALMIC

## 2017-06-02 MED ORDER — TETANUS-DIPHTH-ACELL PERTUSSIS 5-2.5-18.5 LF-MCG/0.5 IM SUSP
0.5000 mL | Freq: Once | INTRAMUSCULAR | Status: AC
  Administered 2017-06-02: 0.5 mL via INTRAMUSCULAR
  Filled 2017-06-02: qty 0.5

## 2017-06-02 MED ORDER — FLUORESCEIN SODIUM 1 MG OP STRP
1.0000 | ORAL_STRIP | Freq: Once | OPHTHALMIC | Status: DC
  Filled 2017-06-02: qty 1

## 2017-06-02 MED ORDER — TETANUS-DIPHTHERIA TOXOIDS TD 5-2 LFU IM INJ: 0.5000 mL | INJECTION | Freq: Once | INTRAMUSCULAR | Status: DC

## 2017-06-02 MED ORDER — TETRACAINE HCL 0.5 % OP SOLN
2.0000 [drp] | Freq: Once | OPHTHALMIC | Status: AC
  Administered 2017-06-02: 2 [drp] via OPHTHALMIC
  Filled 2017-06-02: qty 4

## 2017-06-02 NOTE — Discharge Instructions (Signed)
Go directly to Dr. Laruth BouchardGroat's office when you leave here

## 2017-06-02 NOTE — ED Triage Notes (Signed)
Patient reports a foreign object in right eye since last night. Patient states he noticed it last night. Today, patient states he feels like something is scratching his eye. patiaent works in [employer] and feels like it could be a piece of tile or glass.

## 2017-06-02 NOTE — ED Provider Notes (Signed)
[facility]-EMERGENCY DEPT Provider Note   CSN: [id number] Arrival date & time: 06/02/17  0844     History   Chief Complaint Chief Complaint  Patient presents with  . foreign object in eye    HPI Darren Dominguez is a 27 y.o. male.  27 year old male presents with foreign body to his right eye times 24 hours.  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  Since then he has had a scratching sensation.  He does not wear contacts and he was not using any eye protection.  No drainage or fever noted.  Patient has experienced some blurred vision but no actual visual loss.      Past Medical History:  Diagnosis Date  . Arthritis     There are no active problems to display for this patient.   Past Surgical History:  Procedure Laterality Date  . CLAVICLE SURGERY         Home Medications    Prior to Admission medications   Medication Sig Start Date End Date Taking? Authorizing Provider  glucosamine-chondroitin 500-400 MG tablet Take 1 tablet by mouth at bedtime.   Yes [provider]  ibuprofen (ADVIL,MOTRIN) 200 MG tablet Take 400 mg by mouth every 6 (six) hours as needed for mild pain.   Yes [provider]  cloNIDine (CATAPRES) 0.1 MG tablet Take 1 tablet (0.1 mg total) by mouth 2 (two) times daily as needed (for agitation). Patient not taking: Reported on 06/02/2017 08/20/15   Antony MaduraHumes, Kelly, PA-C  cyclobenzaprine (FLEXERIL) 10 MG tablet Take 1 tablet (10 mg total) by mouth 2 (two) times daily as needed for muscle spasms. Patient not taking: Reported on 06/02/2017 03/15/17   Janne NapoleonNeese, Hope M, NP  diclofenac (VOLTAREN) 50 MG EC tablet Take 1 tablet (50 mg total) by mouth 2 (two) times daily. Patient not taking: Reported on 06/02/2017 03/15/17   Janne NapoleonNeese, Hope M, NP  promethazine (PHENERGAN) 25 MG tablet Take 1 tablet (25 mg total) by mouth every 6 (six) hours as needed for nausea or vomiting. Patient not taking:  Reported on 06/02/2017 08/20/15   Antony MaduraHumes, Kelly, PA-C    Family History Family History  Problem Relation Age of Onset  . Cancer Father     Social History Social History   Tobacco Use  . Smoking status: Current Every Day Smoker    Packs/day: 0.15    Types: Cigarettes  . Smokeless tobacco: Never Used  Substance Use Topics  . Alcohol use: No  . Drug use: No    Comment: no opiates x 2 years     Allergies   Patient has no known allergies.   Review of Systems Review of Systems  All other systems reviewed and are negative.    Physical Exam Updated Vital Signs BP 122/69 (BP Location: Right Arm)   Pulse 78   Temp 98 F (36.7 C) (Oral)   Resp 17   Ht 1.778 m (5\' 10" )   Wt 72.1 kg (159 lb)   SpO2 100%   BMI 22.81 kg/m   Physical Exam  Constitutional: He is oriented to person, place, and time. He appears well-developed and well-nourished.  Non-toxic appearance. No distress.  HENT:  Head: Normocephalic and atraumatic.  Eyes: EOM are normal. Pupils are equal, round, and reactive to light. Foreign body present in the right eye. Right conjunctiva is injected.    Neck: Normal range of motion. Neck supple. No tracheal deviation present. No  thyroid mass present.  Cardiovascular: Normal rate, regular rhythm and normal heart sounds. Exam reveals no gallop.  No murmur heard. Pulmonary/Chest: Effort normal and breath sounds normal. No stridor. No respiratory distress. He has no decreased breath sounds. He has no wheezes. He has no rhonchi. He has no rales.  Abdominal: Soft. Normal appearance and bowel sounds are normal. He exhibits no distension. There is no tenderness. There is no rebound and no CVA tenderness.  Musculoskeletal: Normal range of motion. He exhibits no edema or tenderness.  Neurological: He is alert and oriented to person, place, and time. He has normal strength. No cranial nerve deficit or sensory deficit. GCS eye subscore is 4. GCS verbal subscore is 5. GCS motor  subscore is 6.  Skin: Skin is warm and dry. No abrasion and no rash noted.  Psychiatric: He has a normal mood and affect. His speech is normal and behavior is normal.  Nursing note and vitals reviewed.    ED Treatments / Results  Labs (all labs ordered are listed, but only abnormal results are displayed) Labs Reviewed - No data to display  EKG  EKG Interpretation None       Radiology No results found.  Procedures .Foreign Body Removal Date/Time: 06/02/2017 10:46 AM Performed by: Lorre Nick, MD Authorized by: Lorre Nick, MD  Consent: Verbal consent obtained. Risks and benefits: risks, benefits and alternatives were discussed Consent given by: patient Patient understanding: patient states understanding of the procedure being performed Patient consent: the patient's understanding of the procedure matches consent given Patient identity confirmed: verbally with patient Time out: Immediately prior to procedure a "time out" was called to verify the correct patient, procedure, equipment, support staff and site/side marked as required. Body area: eye Location details: right cornea  Anesthesia: Local Anesthetic: tetracaine drops  Sedation: Patient sedated: no  Patient restrained: no Patient cooperative: yes Localization method: eyelid eversion, slit lamp, visualized and magnification Removal mechanism: 25-gauge needle Eye examined with fluorescein. No fluorescein uptake. Corneal abrasion location: central Residual rust ring present. Depth: superficial Complexity: simple Post-procedure assessment: residual foreign bodies remain Patient tolerance: Patient tolerated the procedure well with no immediate complications   (including critical care time)  Medications Ordered in ED Medications  tetracaine (PONTOCAINE) 0.5 % ophthalmic solution 2 drop (not administered)  fluorescein ophthalmic strip 1 strip (not administered)  tetanus & diphtheria toxoids (adult)  (TENIVAC) injection 0.5 mL (not administered)     Initial Impression / Assessment and Plan / ED Course  I have reviewed the triage vital signs and the nursing notes.  Pertinent labs & imaging results that were available during my care of the patient were reviewed by me and considered in my medical decision making (see chart for details).     Patient's tetanus status updated and discussed with ophthalmologist Dr. Dione Booze will see the patient in his office today Final Clinical Impressions(s) / ED Diagnoses   Final diagnoses:  None    ED Discharge Orders    None       Lorre Nick, MD 06/02/17 1048

## 2017-12-30 IMAGING — DX DG LUMBAR SPINE 2-3V
3 series · 3 of 3 positions shown · non-contrast
Comparison: None in PACs

CLINICAL DATA: Ionic low back pain. History of previous back
injuries.

EXAM:
LUMBAR SPINE - 2-3 VIEW

[dg lumbar spine 2-3 views (1 of 3)]
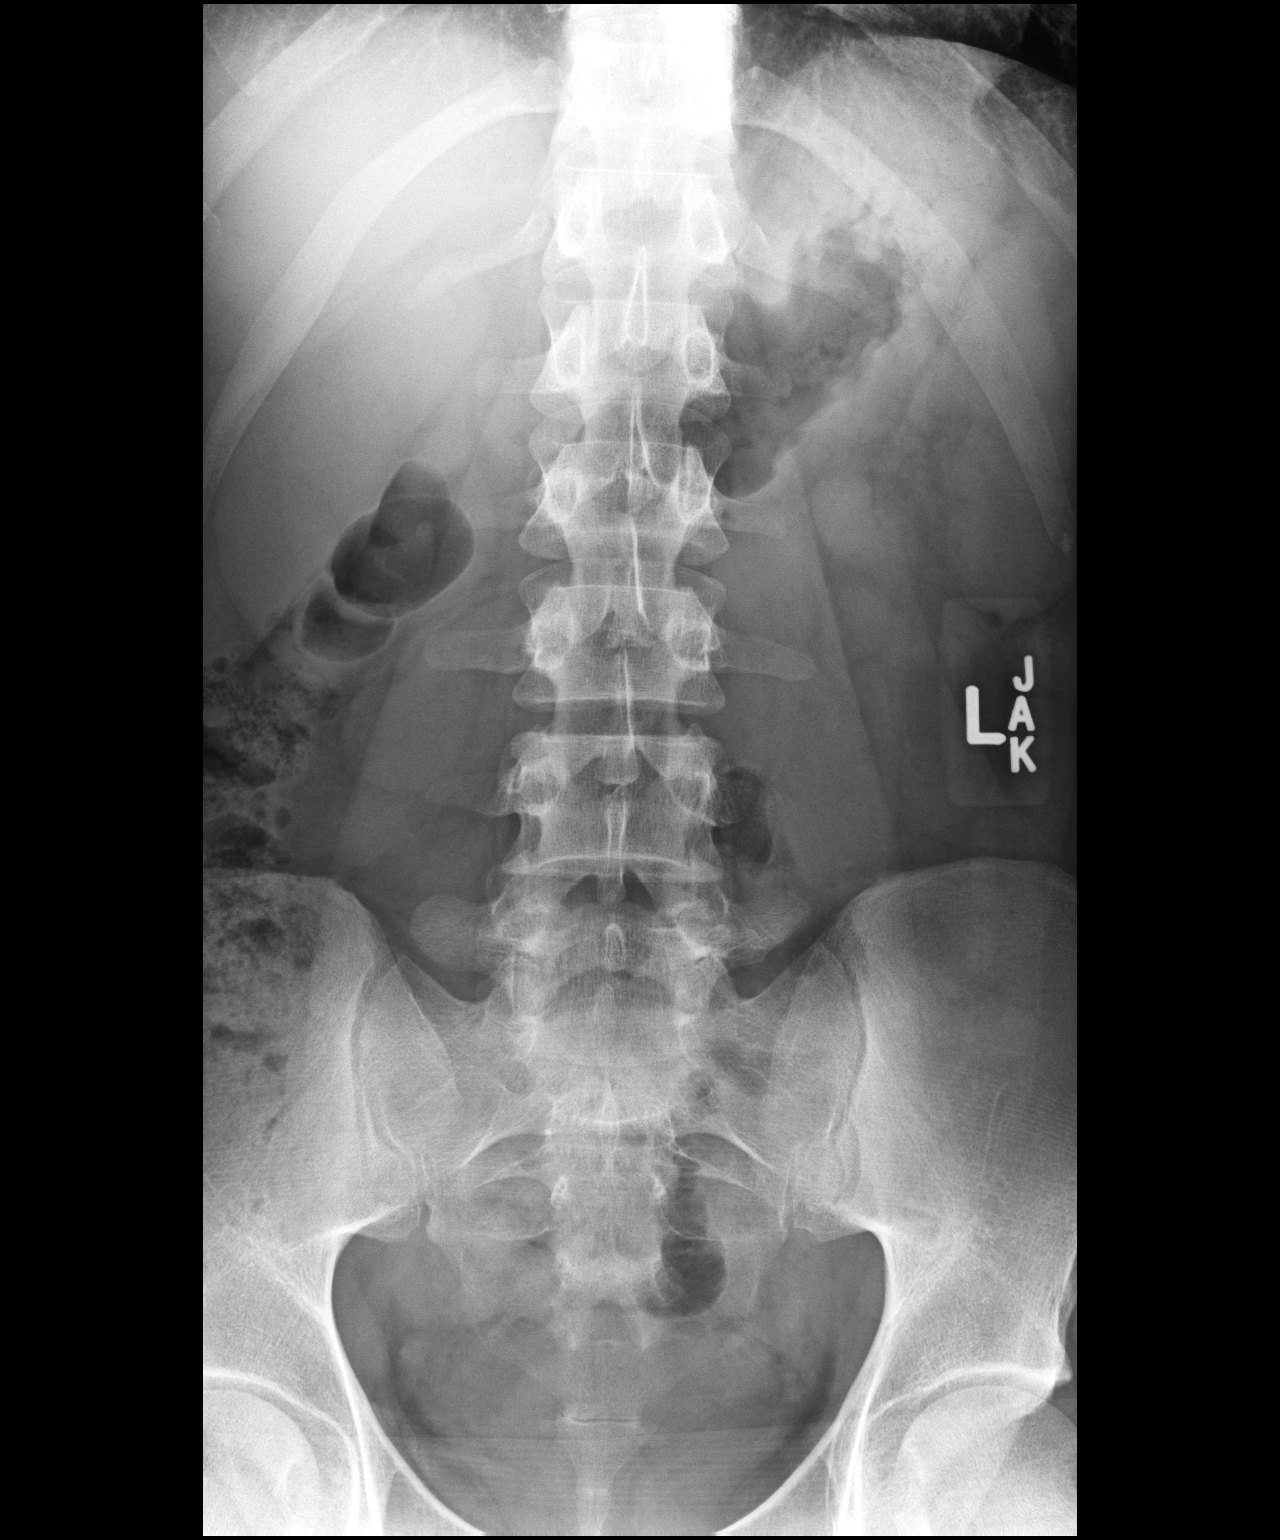

[dg lumbar spine 2-3 views (2 of 3)]
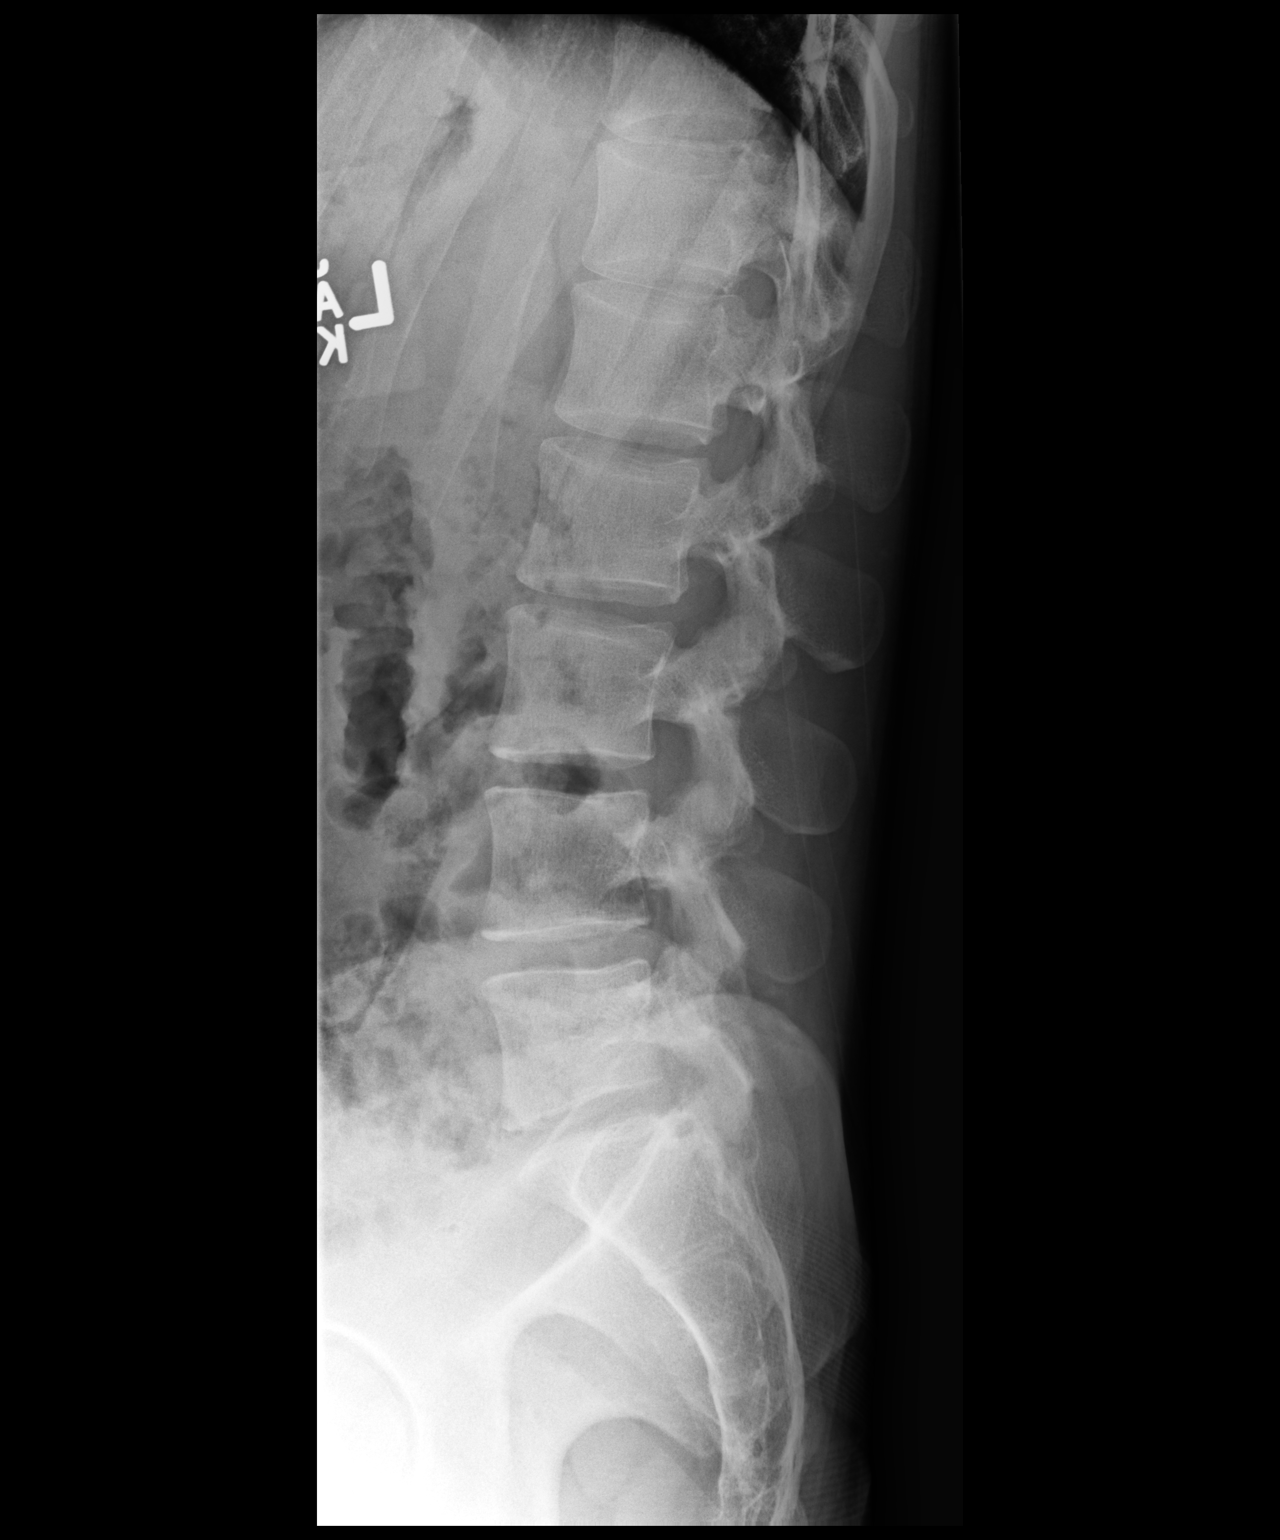

[dg lumbar spine 2-3 views (3 of 3)]
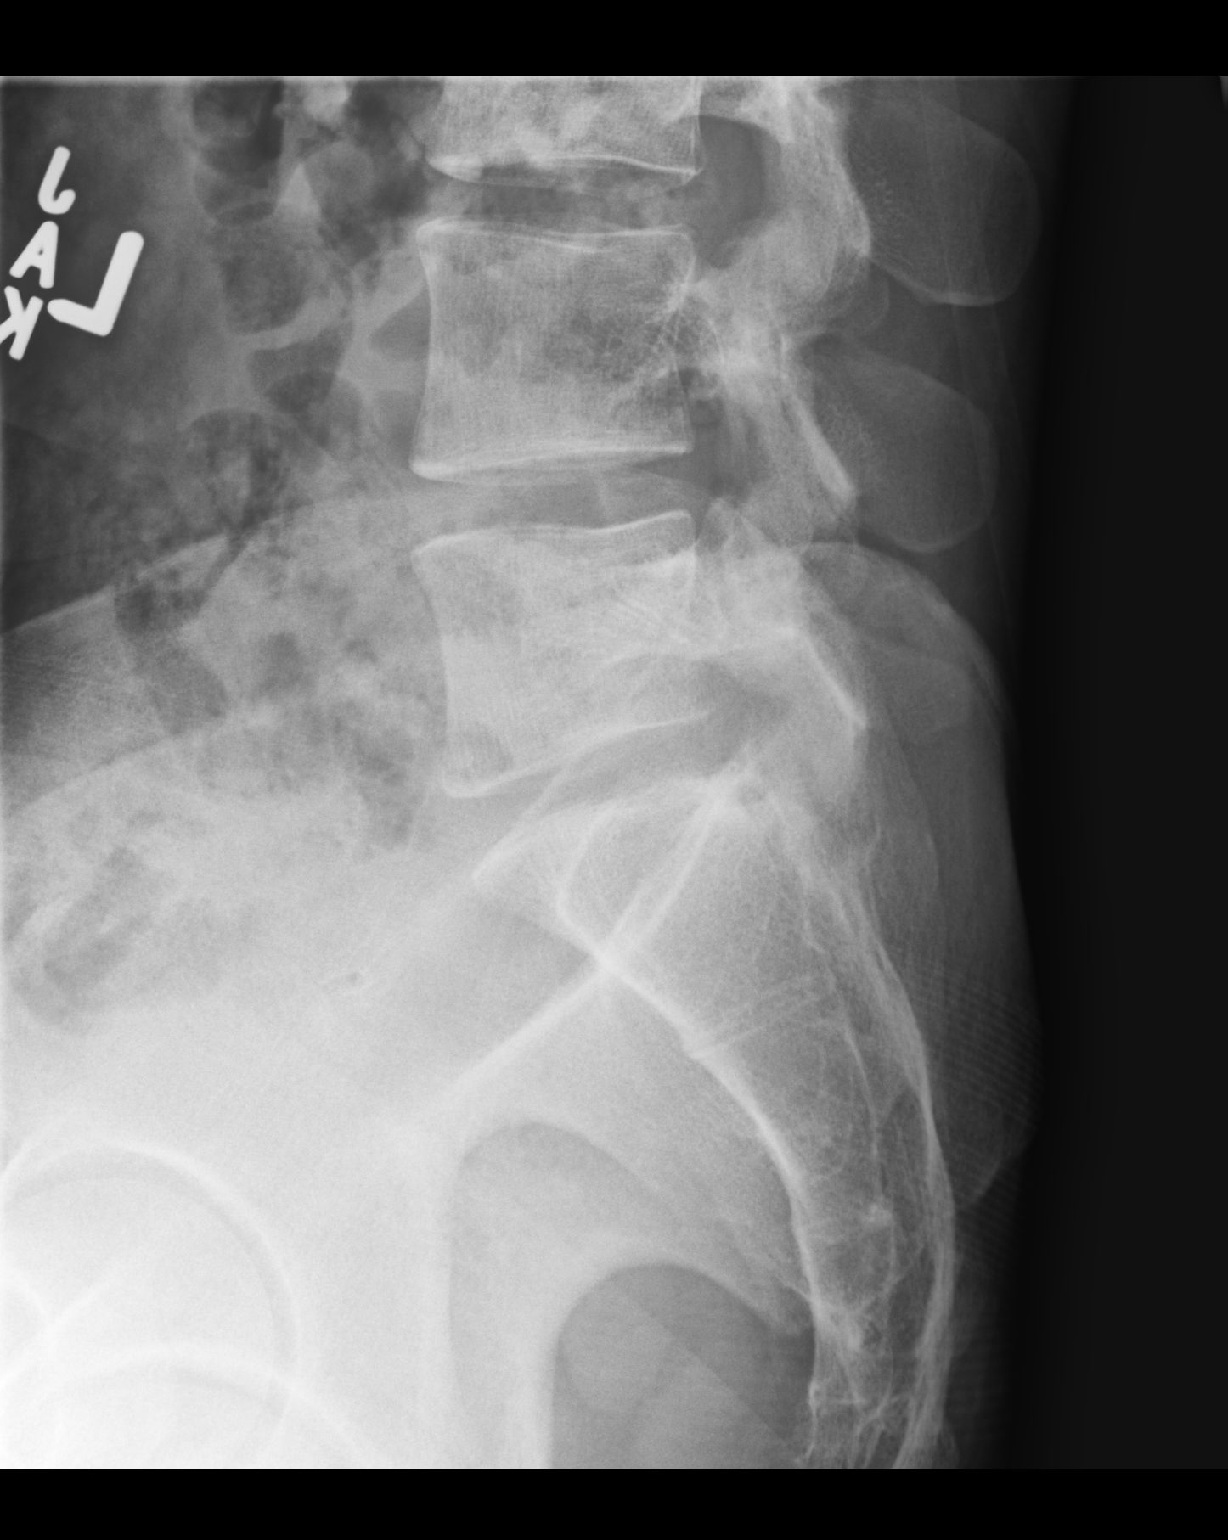

[3 of 3 positions shown; findings below may reference images not displayed]

FINDINGS: The lumbar vertebral bodies are preserved in height. The pedicles
and transverse processes are intact. There is no spondylolisthesis.
There is mild disc space narrowing at L4-5. There is no
spondylolisthesis. There is no significant facet joint hypertrophy.
The observed portions of the sacrum are normal.
IMPRESSION: Mild degenerative disc space narrowing at L4-5. No acute or
significant chronic bony abnormality.
